# Patient Record
Sex: Female | Born: 1970 | Race: White | Hispanic: No | Marital: Single | Smoking: Never smoker
Health system: Southern US, Community
[De-identification: ages and names within clinical notes are randomized; demographics above are authoritative.]

## PROBLEM LIST (undated history)

## (undated) DIAGNOSIS — T7840XA Allergy, unspecified, initial encounter: Secondary | ICD-10-CM

## (undated) HISTORY — PX: TONSILLECTOMY: SUR1361

## (undated) HISTORY — DX: Allergy, unspecified, initial encounter: T78.40XA

## (undated) HISTORY — PX: DENTAL SURGERY: SHX609

---

## 2012-08-22 ENCOUNTER — Other Ambulatory Visit: Payer: Self-pay | Admitting: Neurosurgery

## 2012-08-22 DIAGNOSIS — M545 Low back pain, unspecified: Secondary | ICD-10-CM

## 2014-10-08 ENCOUNTER — Ambulatory Visit (INDEPENDENT_AMBULATORY_CARE_PROVIDER_SITE_OTHER): Payer: Self-pay | Admitting: Family Medicine

## 2014-10-08 VITALS — BP 100/64 | HR 76 | Ht 65.8 in | Wt 133.0 lb

## 2014-10-08 DIAGNOSIS — Z9289 Personal history of other medical treatment: Secondary | ICD-10-CM

## 2014-10-08 NOTE — Progress Notes (Signed)
[employer] PE 1st class

## 2014-10-12 ENCOUNTER — Encounter: Payer: Self-pay | Admitting: Family Medicine

## 2017-02-22 ENCOUNTER — Other Ambulatory Visit: Payer: Self-pay | Admitting: Obstetrics and Gynecology

## 2017-02-22 DIAGNOSIS — R928 Other abnormal and inconclusive findings on diagnostic imaging of breast: Secondary | ICD-10-CM

## 2017-02-26 ENCOUNTER — Ambulatory Visit
Admission: RE | Admit: 2017-02-26 | Discharge: 2017-02-26 | Disposition: A | Discharge status: Home / Self Care | Payer: BLUE CROSS/BLUE SHIELD | Source: Ambulatory Visit | Attending: Obstetrics and Gynecology | Admitting: Obstetrics and Gynecology

## 2017-02-26 ENCOUNTER — Ambulatory Visit
Admission: RE | Admit: 2017-02-26 | Discharge: 2017-02-26 | Disposition: A | Discharge status: Home / Self Care | Payer: Self-pay | Source: Ambulatory Visit | Attending: Obstetrics and Gynecology | Admitting: Obstetrics and Gynecology

## 2017-02-26 DIAGNOSIS — R928 Other abnormal and inconclusive findings on diagnostic imaging of breast: Secondary | ICD-10-CM

## 2017-02-28 ENCOUNTER — Other Ambulatory Visit: Payer: Self-pay

## 2018-05-07 IMAGING — MG 2D DIGITAL DIAGNOSTIC UNILATERAL RIGHT MAMMOGRAM WITH CAD AND AD
9 series · 9 of 21 positions shown · non-contrast
Comparison: Previous exam(s).

CLINICAL DATA: 46-year-old female recalled from screening mammogram
dated 02/20/2017 for a possible right breast asymmetry.

EXAM:
2D DIGITAL DIAGNOSTIC UNILATERAL RIGHT MAMMOGRAM WITH CAD AND
ADJUNCT TOMO

[R CC synth-2D]
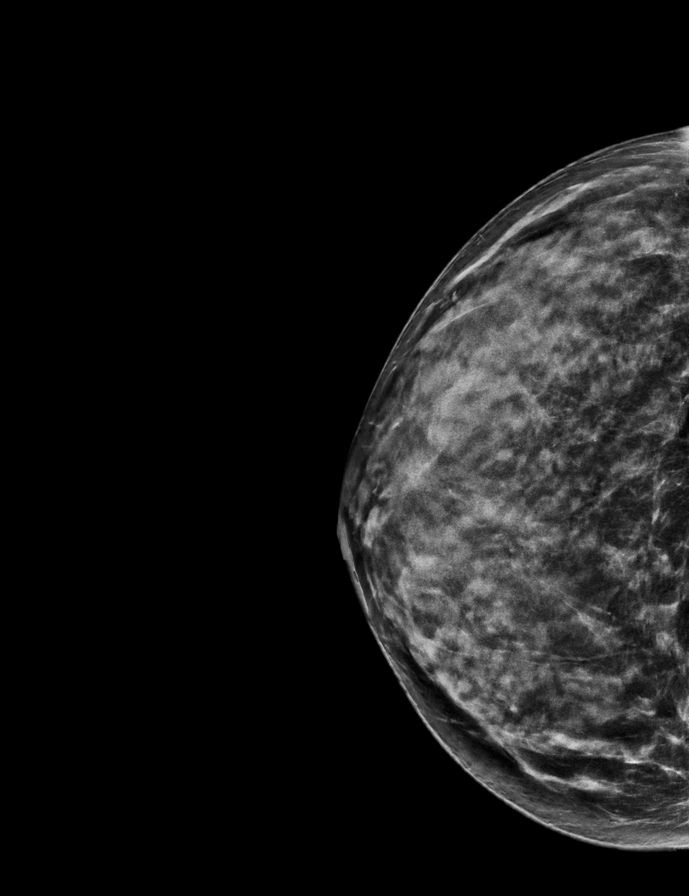

[R MLO synth-2D (1 of 2)]
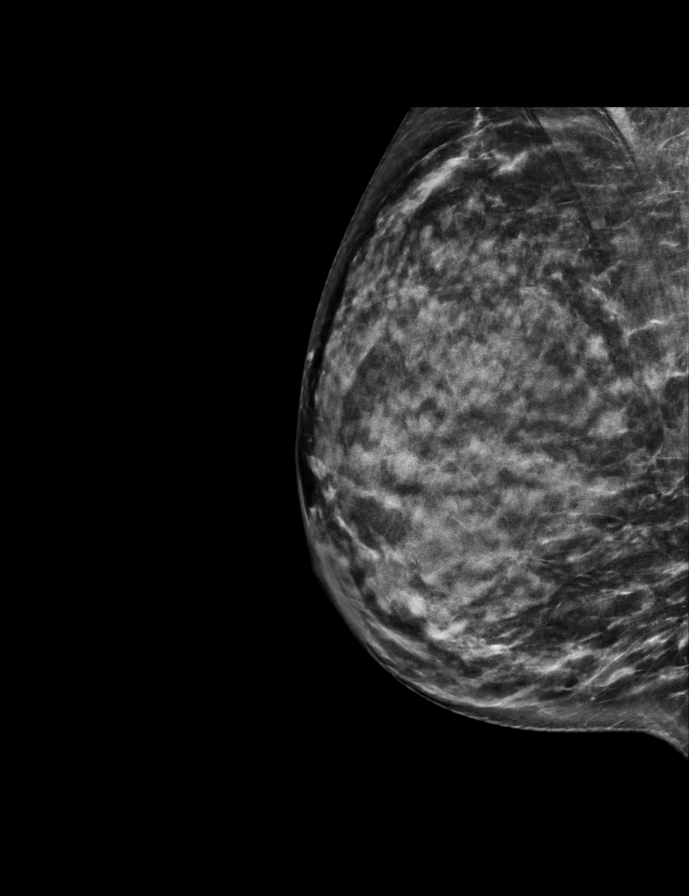

[R CC]
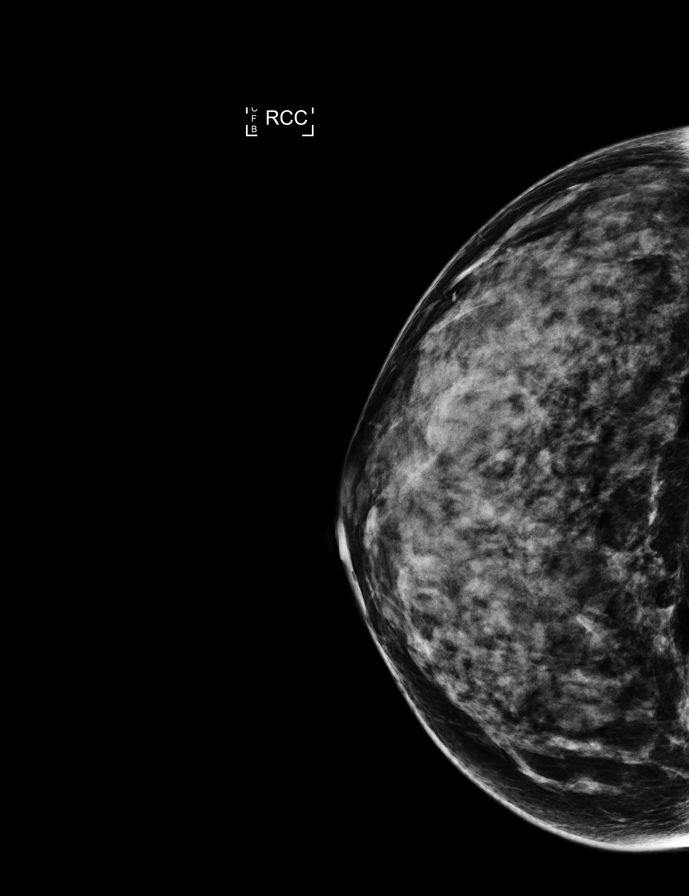

[R MLO synth-2D (2 of 2)]
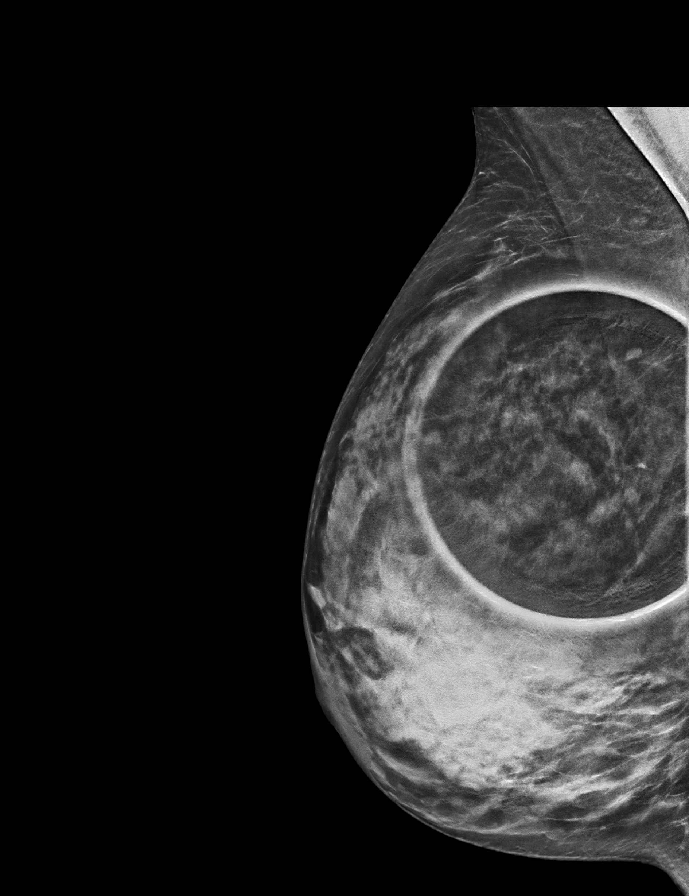

[R MLO (1 of 2)]
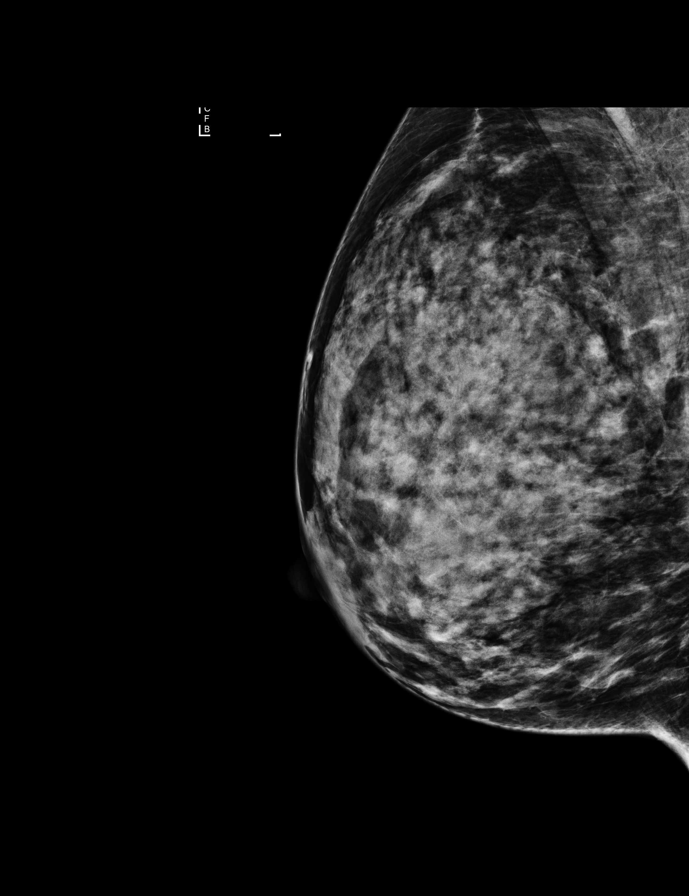

[R MLO (2 of 2)]
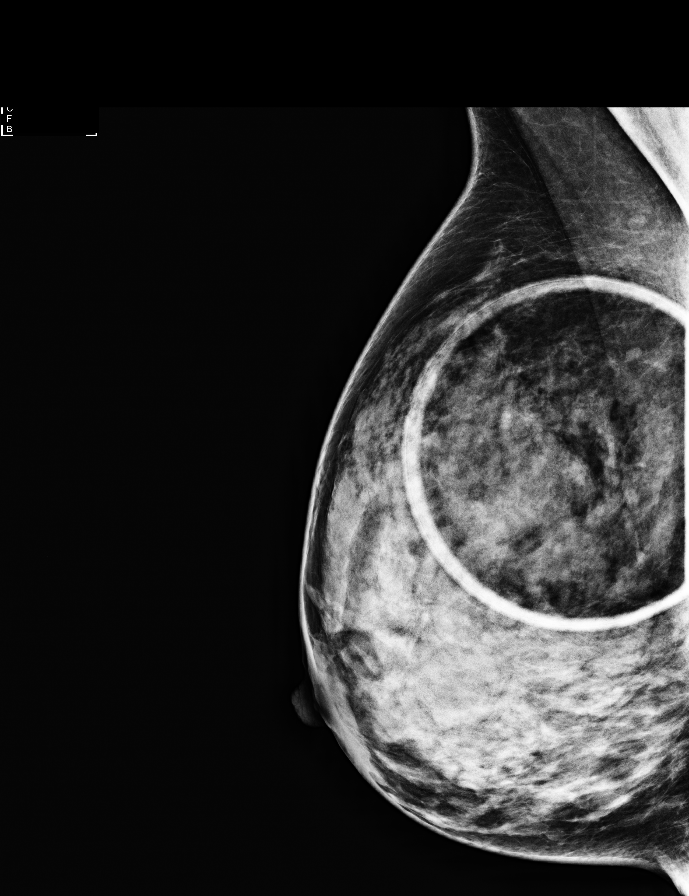

[R MLO tomo (1 of 2) · tomo slice 27/54.0]
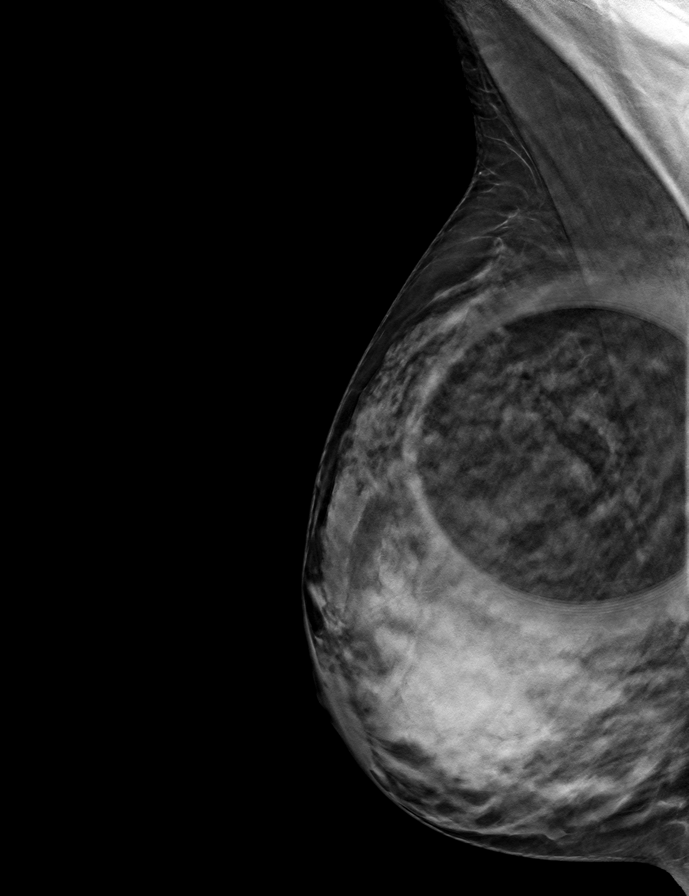

[R CC tomo · tomo slice 29/57.0]
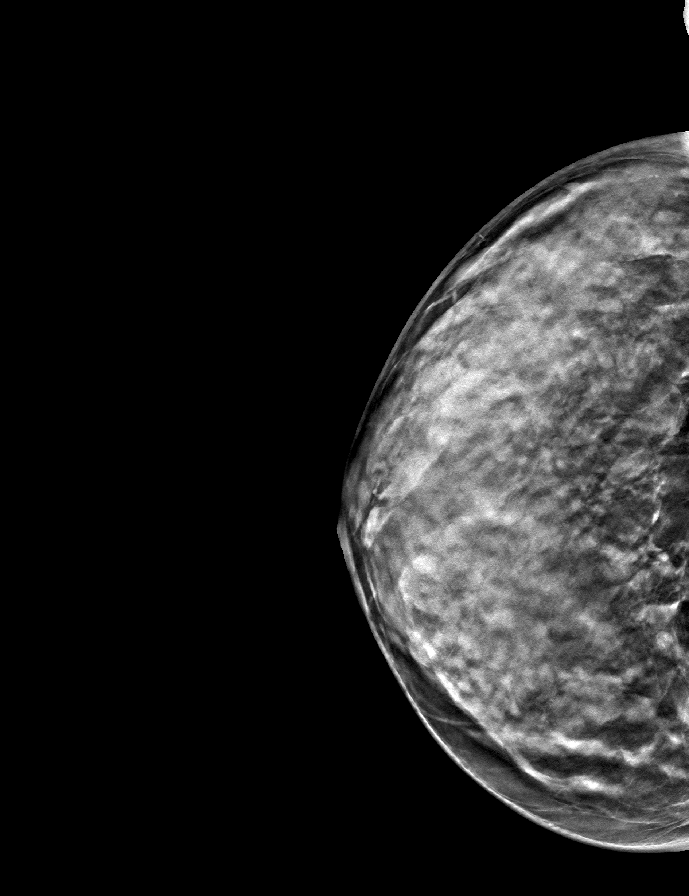

[R MLO tomo (2 of 2) · tomo slice 29/58.0]
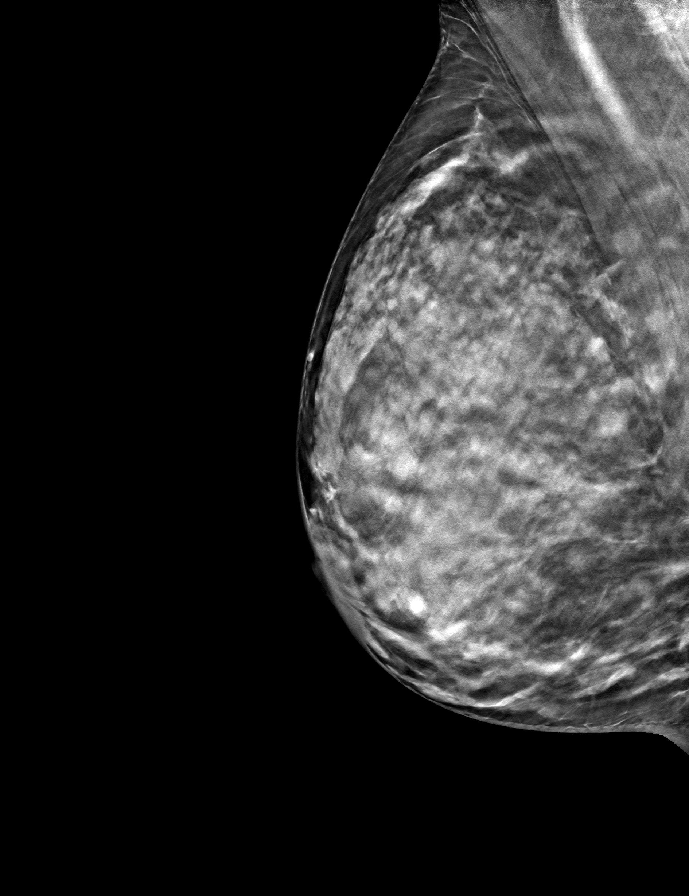

[9 of 21 positions shown; findings below may reference images not displayed]

ACR Breast Density Category d: The breast tissue is extremely dense,
which lowers the sensitivity of mammography.
FINDINGS: The previously described possible asymmetry in the far posterior
central aspect on the CC projection resolves into well dispersed
fibroglandular tissue on today's additional views. No suspicious
mammographic findings are identified.

Mammographic images were processed with CAD.
IMPRESSION: No mammographic evidence of malignancy.

RECOMMENDATION:
Screening mammogram in one year.(Code:[id number])

I have discussed the findings and recommendations with the patient.
Results were also provided in writing at the conclusion of the
visit. If applicable, a reminder letter will be sent to the patient
regarding the next appointment.

BI-RADS CATEGORY  1: Negative.

## 2020-01-23 ENCOUNTER — Encounter: Payer: Self-pay | Admitting: Family Medicine

## 2020-01-23 ENCOUNTER — Other Ambulatory Visit: Payer: Self-pay

## 2020-01-23 ENCOUNTER — Ambulatory Visit (INDEPENDENT_AMBULATORY_CARE_PROVIDER_SITE_OTHER): Payer: Managed Care, Other (non HMO) | Admitting: Family Medicine

## 2020-01-23 VITALS — BP 98/60 | HR 95 | Temp 98.8°F | Ht 65.0 in | Wt 134.4 lb

## 2020-01-23 DIAGNOSIS — Z1159 Encounter for screening for other viral diseases: Secondary | ICD-10-CM

## 2020-01-23 DIAGNOSIS — Z0000 Encounter for general adult medical examination without abnormal findings: Secondary | ICD-10-CM | POA: Diagnosis not present

## 2020-01-23 DIAGNOSIS — L239 Allergic contact dermatitis, unspecified cause: Secondary | ICD-10-CM | POA: Diagnosis not present

## 2020-01-23 NOTE — Progress Notes (Signed)
Subjective  Chief Complaint  Patient presents with  . 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 Annual Exam    rash present on right arm - after Moderna vaccine     HPI: Cynthia Drake is a 49 y.o. female who presents to [facility] today for a Female Wellness Visit.   Wellness Visit: annual visit with health maintenance review and exam without Pap   Health maintenance: 49 year old G1 P0-0-1-0 premenopausal female presents for physical.  Saw her gynecologist in April.  Reports normal mammogram and Pap smear at that time.  Healthy.  Healthy lifestyle.  Surfer, pilot.  Happy.  Has a part-time boyfriend.  No history of major health problems.  He has rash on her right arm.  Started over the last several months.  [facility]  Not spreading.  Itching.  Assessment  1. Annual physical exam   2. Need for hepatitis C screening test   3. Allergic contact dermatitis, unspecified trigger      Plan  Female Wellness Visit:  Age appropriate Health Maintenance and Prevention measures were discussed with patient. Included topics are cancer screening recommendations, ways to keep healthy (see AVS) including dietary and exercise recommendations, regular eye and dental care, use of seat belts, and avoidance of moderate alcohol use and tobacco use.  Screens are up-to-date.  BMI: discussed patient's BMI and encouraged positive lifestyle modifications to help get to or maintain a target BMI.  HM needs and immunizations were addressed and ordered. See below for orders. See HM and immunization section for updates.  Declines flu shot today.  Routine labs and screening tests ordered including cmp, cbc and lipids where appropriate.  Discussed recommendations regarding Vit D and calcium supplementation (see AVS)  Rash most consistent with an irritant or allergic contact  dermatitis.  Localized.  Trial of steroid cream.  She has follow-up appoint with dermatology if needed.  Follow up: Return in about 1 year (around 01/22/2021) for complete physical.   Orders Placed This Encounter  Procedures  . CBC with Differential/Platelet  . COMPLETE METABOLIC PANEL WITH GFR  . Lipid panel  . TSH  . HIV Antibody (routine testing w rflx)  . Hepatitis C antibody   No orders of the defined types were placed in this encounter.    Lifestyle: Body mass index is 22.37 kg/m. Wt Readings from Last 3 Encounters:  01/23/20 134 lb 6.4 oz (61 kg)  10/08/14 133 lb (60.3 kg)     There are no problems to display for this patient.  Health Maintenance  Topic Date Due  . Hepatitis C Screening  Never done  . HIV Screening  Never done  . TETANUS/TDAP  Never done  . INFLUENZA VACCINE  07/22/2020 (Originally 11/23/2019)  . PAP SMEAR-Modifier  07/24/2022  . COVID-19 Vaccine  Completed   Immunization History  Administered Date(s) Administered  . Moderna SARS-COVID-2 Vaccination 11/19/2019, 12/17/2019   We updated and reviewed the patient's past history in detail and it is documented below. Allergies: Patient is allergic to latex. Past Medical History Patient  has a past medical history of Allergy. Past Surgical History Patient  has a past surgical history that includes Tonsillectomy and Dental surgery. Family History: Patient family history is not on file. Social History:  Patient  reports that she has never smoked. She has never used  smokeless tobacco.  Review of Systems: Constitutional: negative for fever or malaise Ophthalmic: negative for photophobia, double vision or loss of vision Cardiovascular: negative for chest pain, dyspnea on exertion, or new LE swelling Respiratory: negative for SOB or persistent cough Gastrointestinal: negative for abdominal pain, change in bowel habits or melena Genitourinary: negative for dysuria or gross hematuria, no abnormal  uterine bleeding or disharge Musculoskeletal: negative for new gait disturbance or muscular weakness Integumentary: negative for new or persistent rashes, no breast lumps Neurological: negative for TIA or stroke symptoms Psychiatric: negative for SI or delusions Allergic/Immunologic: negative for hives  Patient Care Team    Relationship Specialty Notifications Start End  Willow Ora, MD PCP - General Family Medicine  01/23/20   Willow Ora, MD Consulting Physician Family Medicine  01/23/20 01/23/20  Waynard Reeds, MD Consulting Physician Obstetrics and Gynecology  01/23/20   Elmon Else, MD Consulting Physician Dermatology  01/23/20     Objective  Vitals: BP 98/60   Pulse 95   Temp 98.8 F (37.1 C) (Temporal)   Ht 5\' 5"  (1.651 m)   Wt 134 lb 6.4 oz (61 kg)   SpO2 98%   BMI 22.37 kg/m  General:  Well developed, well nourished, no acute distress  Psych:  Alert and orientedx3,normal mood and affect HEENT:  Normocephalic, atraumatic, non-icteric sclera, supple neck without adenopathy, mass or thyromegaly Cardiovascular:  Normal S1, S2, RRR without gallop, rub or murmur Respiratory:  Good breath sounds bilaterally, CTAB with normal respiratory effort Gastrointestinal: normal bowel sounds, soft, non-tender, no noted masses. No HSM MSK: no deformities, contusions. Joints are without erythema or swelling.  Skin:  Warm, right arm with several papules, some with flaking.  Some scabbed.  No other rash throughout body. Neurologic:    Mental status is normal. CN 2-11 are normal. Gross motor and sensory exams are normal. Normal gait. No tremor    Commons side effects, risks, benefits, and alternatives for medications and treatment plan prescribed today were discussed, and the patient expressed understanding of the given instructions. Patient is instructed to call or message via MyChart if he/she has any questions or concerns regarding our treatment plan. No barriers to understanding were  identified. We discussed Red Flag symptoms and signs in detail. Patient expressed understanding regarding what to do in case of urgent or emergency type symptoms.   Medication list was reconciled, printed and provided to the patient in AVS. Patient instructions and summary information was reviewed with the patient as documented in the AVS. This note was prepared with assistance of Dragon voice recognition software. Occasional wrong-word or sound-a-like substitutions may have occurred due to the inherent limitations of voice recognition software  This visit occurred during the SARS-CoV-2 public health emergency.  Safety protocols were in place, including screening questions prior to the visit, additional usage of staff PPE, and extensive cleaning of exam room while observing appropriate contact time as indicated for disinfecting solutions.

## 2020-01-23 NOTE — Patient Instructions (Addendum)
Please return in 12 months for your annual complete physical; please come fasting.  Please sign up for Mychart. I have texted you the link. I will release your lab results to you on your MyChart account with further instructions. Please reply with any questions.   Check to see if you've had your Tdap, Td or tetanus vaccination within the last year and let me know.  It was a pleasure meeting you today! Thank you for choosing [place] to meet your healthcare needs! I truly look forward to working with you. If you have any questions or concerns, please send me a message via Mychart or call the office at [phone].   Preventive Care 47-27 Years Old, Female Preventive care refers to visits with your health care provider and lifestyle choices that can promote health and wellness. This includes:  A yearly physical exam. This may also be called an annual well check.  Regular dental visits and eye exams.  Immunizations.  Screening for certain conditions.  Healthy lifestyle choices, such as eating a healthy diet, getting regular exercise, not using drugs or products that contain nicotine and tobacco, and limiting alcohol use. What can I expect for my preventive care visit? Physical exam Your health care provider will check your:  Height and weight. This may be used to calculate body mass index (BMI), which tells if you are at a healthy weight.  Heart rate and blood pressure.  Skin for abnormal spots. Counseling Your health care provider may ask you questions about your:  Alcohol, tobacco, and drug use.  Emotional well-being.  Home and relationship well-being.  Sexual activity.  Eating habits.  Work and work Statistician.  Method of birth control.  Menstrual cycle.  Pregnancy history. What immunizations do I need?  Influenza (flu) vaccine  This is recommended every year. Tetanus, diphtheria, and pertussis (Tdap) vaccine  You may need a Td booster every 10 years. Varicella  (chickenpox) vaccine  You may need this if you have not been vaccinated. Human papillomavirus (HPV) vaccine  If recommended by your health care provider, you may need three doses over 6 months. Measles, mumps, and rubella (MMR) vaccine  You may need at least one dose of MMR. You may also need a second dose. Meningococcal conjugate (MenACWY) vaccine  One dose is recommended if you are age 59-21 years and a first-year college student living in a residence hall, or if you have one of several medical conditions. You may also need additional booster doses. Pneumococcal conjugate (PCV13) vaccine  You may need this if you have certain conditions and were not previously vaccinated. Pneumococcal polysaccharide (PPSV23) vaccine  You may need one or two doses if you smoke cigarettes or if you have certain conditions. Hepatitis A vaccine  You may need this if you have certain conditions or if you travel or work in places where you may be exposed to hepatitis A. Hepatitis B vaccine  You may need this if you have certain conditions or if you travel or work in places where you may be exposed to hepatitis B. Haemophilus influenzae type b (Hib) vaccine  You may need this if you have certain conditions. You may receive vaccines as individual doses or as more than one vaccine together in one shot (combination vaccines). Talk with your health care provider about the risks and benefits of combination vaccines. What tests do I need?  Blood tests  Lipid and cholesterol levels. These may be checked every 5 years starting at age 24.  Hepatitis  C test.  Hepatitis B test. Screening  Diabetes screening. This is done by checking your blood sugar (glucose) after you have not eaten for a while (fasting).  Sexually transmitted disease (STD) testing.  BRCA-related cancer screening. This may be done if you have a family history of breast, ovarian, tubal, or peritoneal cancers.  Pelvic exam and Pap test.  This may be done every 3 years starting at age 70. Starting at age 29, this may be done every 5 years if you have a Pap test in combination with an HPV test. Talk with your health care provider about your test results, treatment options, and if necessary, the need for more tests. Follow these instructions at home: Eating and drinking   Eat a diet that includes fresh fruits and vegetables, whole grains, lean protein, and low-fat dairy.  Take vitamin and mineral supplements as recommended by your health care provider.  Do not drink alcohol if: ? Your health care provider tells you not to drink. ? You are pregnant, may be pregnant, or are planning to become pregnant.  If you drink alcohol: ? Limit how much you have to 0-1 drink a day. ? Be aware of how much alcohol is in your drink. In the [place] one drink equals one 12 oz bottle of beer (355 mL), one 5 oz glass of wine (148 mL), or one 1 oz glass of hard liquor (44 mL). Lifestyle  Take daily care of your teeth and gums.  Stay active. Exercise for at least 30 minutes on 5 or more days each week.  Do not use any products that contain nicotine or tobacco, such as cigarettes, e-cigarettes, and chewing tobacco. If you need help quitting, ask your health care provider.  If you are sexually active, practice safe sex. Use a condom or other form of birth control (contraception) in order to prevent pregnancy and STIs (sexually transmitted infections). If you plan to become pregnant, see your health care provider for a preconception visit. What's next?  Visit your health care provider once a year for a well check visit.  Ask your health care provider how often you should have your eyes and teeth checked.  Stay up to date on all vaccines. This information is not intended to replace advice given to you by your health care provider. Make sure you discuss any questions you have with your health care provider. Document Revised: 12/20/2017 Document  Reviewed: 12/20/2017 Elsevier Patient Education  2020 [employer].

## 2020-01-25 ENCOUNTER — Encounter: Payer: Self-pay | Admitting: Family Medicine

## 2020-01-26 LAB — CBC WITH DIFFERENTIAL/PLATELET
Absolute Monocytes: 319 cells/uL (ref 200–950)
Basophils Absolute: 39 cells/uL (ref 0–200)
Basophils Relative: 0.7 %
Eosinophils Absolute: 62 cells/uL (ref 15–500)
Eosinophils Relative: 1.1 %
HCT: 40.6 % (ref 35.0–45.0)
Hemoglobin: 14 g/dL (ref 11.7–15.5)
Lymphs Abs: 1971 cells/uL (ref 850–3900)
MCH: 31.3 pg (ref 27.0–33.0)
MCHC: 34.5 g/dL (ref 32.0–36.0)
MCV: 90.6 fL (ref 80.0–100.0)
MPV: 10.7 fL (ref 7.5–12.5)
Monocytes Relative: 5.7 %
Neutro Abs: 3209 cells/uL (ref 1500–7800)
Neutrophils Relative %: 57.3 %
Platelets: 185 10*3/uL (ref 140–400)
RBC: 4.48 10*6/uL (ref 3.80–5.10)
RDW: 12.2 % (ref 11.0–15.0)
Total Lymphocyte: 35.2 %
WBC: 5.6 10*3/uL (ref 3.8–10.8)

## 2020-01-26 LAB — COMPLETE METABOLIC PANEL WITH GFR
AG Ratio: 2.5 (calc) (ref 1.0–2.5)
ALT: 10 U/L (ref 6–29)
AST: 14 U/L (ref 10–35)
Albumin: 4.7 g/dL (ref 3.6–5.1)
Alkaline phosphatase (APISO): 55 U/L (ref 31–125)
BUN: 9 mg/dL (ref 7–25)
CO2: 29 mmol/L (ref 20–32)
Calcium: 9.2 mg/dL (ref 8.6–10.2)
Chloride: 103 mmol/L (ref 98–110)
Creat: 0.77 mg/dL (ref 0.50–1.10)
GFR, Est African American: 107 mL/min/{1.73_m2} (ref >=60)
GFR, Est Non African American: 92 mL/min/{1.73_m2} (ref >=60)
Globulin: 1.9 g/dL (calc) (ref 1.9–3.7)
Glucose, Bld: 90 mg/dL (ref 65–99)
Potassium: 3.7 mmol/L (ref 3.5–5.3)
Sodium: 139 mmol/L (ref 135–146)
Total Bilirubin: 0.6 mg/dL (ref 0.2–1.2)
Total Protein: 6.6 g/dL (ref 6.1–8.1)

## 2020-01-26 LAB — LIPID PANEL
Cholesterol: 206 mg/dL — ABNORMAL HIGH (ref <200)
HDL: 61 mg/dL (ref >=50)
LDL Cholesterol (Calc): 130 mg/dL (calc) — ABNORMAL HIGH
Non-HDL Cholesterol (Calc): 145 mg/dL (calc) — ABNORMAL HIGH (ref <130)
Total CHOL/HDL Ratio: 3.4 (calc) (ref <5.0)
Triglycerides: 56 mg/dL (ref <150)

## 2020-01-26 LAB — TSH: TSH: 2.1 mIU/L

## 2020-01-26 LAB — HEPATITIS C ANTIBODY
Hepatitis C Ab: NONREACTIVE
SIGNAL TO CUT-OFF: 0.01 (ref <1.00)

## 2020-01-26 LAB — HIV ANTIBODY (ROUTINE TESTING W REFLEX): HIV 1&2 Ab, 4th Generation: NONREACTIVE

## 2020-04-28 ENCOUNTER — Ambulatory Visit: Payer: BLUE CROSS/BLUE SHIELD | Admitting: Family Medicine

## 2021-01-27 ENCOUNTER — Encounter: Payer: Managed Care, Other (non HMO) | Admitting: Family Medicine

## 2021-02-09 ENCOUNTER — Other Ambulatory Visit: Payer: Self-pay

## 2021-02-09 ENCOUNTER — Encounter: Payer: Self-pay | Admitting: Family Medicine

## 2021-02-09 ENCOUNTER — Ambulatory Visit (INDEPENDENT_AMBULATORY_CARE_PROVIDER_SITE_OTHER): Payer: BC Managed Care – PPO | Admitting: Family Medicine

## 2021-02-09 VITALS — BP 106/70 | HR 72 | Temp 97.9°F | Ht 65.0 in | Wt 142.4 lb

## 2021-02-09 DIAGNOSIS — S39012A Strain of muscle, fascia and tendon of lower back, initial encounter: Secondary | ICD-10-CM

## 2021-02-09 MED ORDER — DICLOFENAC SODIUM 75 MG PO TBEC: 75.0000 mg | DELAYED_RELEASE_TABLET | Freq: Two times a day (BID) | ORAL | 0 refills | Status: AC

## 2021-02-09 MED ORDER — CYCLOBENZAPRINE HCL 10 MG PO TABS: 10.0000 mg | ORAL_TABLET | Freq: Three times a day (TID) | ORAL | 0 refills | Status: AC | PRN

## 2021-02-09 MED ORDER — TRAMADOL HCL 50 MG PO TABS: 50.0000 mg | ORAL_TABLET | Freq: Three times a day (TID) | ORAL | 0 refills | Status: AC | PRN

## 2021-02-09 NOTE — Patient Instructions (Signed)
Please return in 1-2 weeks to recheck if not improving. Can schedule a complete physical as well. Last was oct 2021.  If you have any questions or concerns, please don't hesitate to send me a message via MyChart or call the office at [phone]. Thank you for visiting with [place] today! It's our pleasure caring for you.   Lumbar Strain A lumbar strain, which is sometimes called a low-back strain, is a stretch or tear in a muscle or the strong cords of tissue that attach muscle to bone (tendons) in the lower back (lumbar spine). This type of injury occurs when muscles or tendons are torn or are stretched beyond their limits. Lumbar strains can range from mild to severe. Mild strains may involve stretching a muscle or tendon without tearing it. These may heal in 1-2 weeks. More severe strains involve tearing of muscle fibers or tendons. These will cause more pain and may take 6-8 weeks to heal. What are the causes? This condition may be caused by: Trauma, such as a fall or a hit to the body. Twisting or overstretching the back. This may result from doing activities that need a lot of energy, such as lifting heavy objects. What increases the risk? This injury is more common in: Athletes. People with obesity. People who do repeated lifting, bending, or other movements that involve their back. What are the signs or symptoms? Symptoms of this condition may include: Sharp or dull pain in the lower back that does not go away. The pain may extend to the buttocks. Stiffness or limited range of motion. Sudden muscle tightening (spasms). How is this diagnosed? This condition may be diagnosed based on: Your symptoms. Your medical history. A physical exam. Imaging tests, such as: X-rays. MRI. How is this treated? Treatment for this condition may include: Rest. Applying heat and cold to the affected area. Over-the-counter medicines to help relieve pain and inflammation, such as NSAIDs. Prescription  pain medicine and muscle relaxants may be needed for a short time. Physical therapy. Follow these instructions at home: Managing pain, stiffness, and swelling   If directed, put ice on the injured area during the first 24 hours after your injury. Put ice in a plastic bag. Place a towel between your skin and the bag. Leave the ice on for 20 minutes, 2-3 times a day. If directed, apply heat to the affected area as often as told by your health care provider. Use the heat source that your health care provider recommends, such as a moist heat pack or a heating pad. Place a towel between your skin and the heat source. Leave the heat on for 20-30 minutes. Remove the heat if your skin turns bright red. This is especially important if you are unable to feel pain, heat, or cold. You may have a greater risk of getting burned. Activity Rest and return to your normal activities as told by your health care provider. Ask your health care provider what activities are safe for you. Do exercises as told by your health care provider. Medicines Take over-the-counter and prescription medicines only as told by your health care provider. Ask your health care provider if the medicine prescribed to you: Requires you to avoid driving or using heavy machinery. Can cause constipation. You may need to take these actions to prevent or treat constipation: Drink enough fluid to keep your urine pale yellow. Take over-the-counter or prescription medicines. Eat foods that are high in fiber, such as beans, whole grains, and fresh fruits and  vegetables. Limit foods that are high in fat and processed sugars, such as fried or sweet foods. Injury prevention To prevent a future low-back injury: Always warm up properly before physical activity or sports. Cool down and stretch after being active. Use correct form when playing sports and lifting heavy objects. Bend your knees before you lift heavy objects. Use good posture when  sitting and standing. Stay physically fit and keep a healthy weight. Do at least 150 minutes of moderate-intensity exercise each week, such as brisk walking or water aerobics. Do strength exercises at least 2 times each week.  General instructions Do not use any products that contain nicotine or tobacco, such as cigarettes, e-cigarettes, and chewing tobacco. If you need help quitting, ask your health care provider. Keep all follow-up visits as told by your health care provider. This is important. Contact a health care provider if: Your back pain does not improve after 6 weeks of treatment. Your symptoms get worse. Get help right away if: Your back pain is severe. You are unable to stand or walk. You develop pain in your legs. You develop weakness in your buttocks or legs. You have difficulty controlling when you urinate or when you have a bowel movement. You have frequent, painful, or bloody urination. You have a temperature over 101.87F (38.3C) Summary A lumbar strain, which is sometimes called a low-back strain, is a stretch or tear in a muscle or the strong cords of tissue that attach muscle to bone (tendons) in the lower back (lumbar spine). This type of injury occurs when muscles or tendons are torn or are stretched beyond their limits. Rest and return to your normal activities as told by your health care provider. If directed, apply heat and ice to the affected area as often as told by your health care provider. Take over-the-counter and prescription medicines only as told by your health care provider. Contact a health care provider if you have new or worsening symptoms. This information is not intended to replace advice given to you by your health care provider. Make sure you discuss any questions you have with your health care provider. Document Revised: 02/07/2018 Document Reviewed: 02/07/2018 Elsevier Patient Education  2022 [employer].

## 2021-02-10 NOTE — Progress Notes (Signed)
Subjective  CC:  Chief Complaint  Patient presents with   Back Pain    Lower, started again yesterday. Low back has been tender for months    HPI: Cynthia Drake is a 50 y.o. female who presents to the office today to address the problems listed above in the chief complaint. 50 year old healthy female complains of significant low back pain limiting activities.  She reports that approximately 2 weeks ago she felt a pull in her low back where she was pulling up her suitcase onto a bus.  Symptoms were minimal.  However yesterday, she went to stand up and felt a significant catch in her back.  Since she has had significant stiffness, pain with movement, without radicular symptoms.  She has no known chronic back problems.  She does report that intermittently her back will "go out".  She is taken some over-the-counter anti-inflammatories which have been helpful.  Because the pain is so significant she is using a walker.  She denies lower extremity weakness, bowel or bladder dysfunction.  No fevers or chills.  Assessment  1. Strain of lumbar region, initial encounter      Plan  Lumbar strain: Education given.  No radicular symptoms at this time.  Will treat with anti-inflammatories, muscle relaxant, heat and tramadol if needed for moderate pain.  Recommend stretching exercises.  Follow-up in 2 weeks if not improving.  Discussed red flag symptoms.  Follow up: 2 weeks if needed. Visit date not found  No orders of the defined types were placed in this encounter.  Meds ordered this encounter  Medications   cyclobenzaprine (FLEXERIL) 10 MG tablet    Sig: Take 1 tablet (10 mg total) by mouth 3 (three) times daily as needed for muscle spasms.    Dispense:  30 tablet    Refill:  0   diclofenac (VOLTAREN) 75 MG EC tablet    Sig: Take 1 tablet (75 mg total) by mouth 2 (two) times daily.    Dispense:  30 tablet    Refill:  0   traMADol (ULTRAM) 50 MG tablet    Sig: Take 1 tablet (50 mg total) by  mouth every 8 (eight) hours as needed for up to 5 days.    Dispense:  15 tablet    Refill:  0      I reviewed the patients updated PMH, FH, and SocHx.    There are no problems to display for this patient.  Current Meds  Medication Sig   cyclobenzaprine (FLEXERIL) 10 MG tablet Take 1 tablet (10 mg total) by mouth 3 (three) times daily as needed for muscle spasms.   diclofenac (VOLTAREN) 75 MG EC tablet Take 1 tablet (75 mg total) by mouth 2 (two) times daily.   traMADol (ULTRAM) 50 MG tablet Take 1 tablet (50 mg total) by mouth every 8 (eight) hours as needed for up to 5 days.    Allergies: Patient is allergic to latex. Family History: Patient family history is not on file. Social History:  Patient  reports that she has never smoked. She has never used smokeless tobacco.  Review of Systems: Constitutional: Negative for fever malaise or anorexia Cardiovascular: negative for chest pain Respiratory: negative for SOB or persistent cough Gastrointestinal: negative for abdominal pain  Objective  Vitals: BP 106/70   Pulse 72   Temp 97.9 F (36.6 C) (Temporal)   Ht 5\' 5"  (1.651 m)   Wt 142 lb 6.4 oz (64.6 kg)   SpO2 97%  BMI 23.70 kg/m  General: no acute distress , A&Ox3 Antalgic gait, significantly decreased forward flexion due to pain.  No tenderness of the low back.  Negative DTRs seated bilaterally, +2 lower extremity reflexes that are symmetric.  Normal lower extremity strength.    Commons side effects, risks, benefits, and alternatives for medications and treatment plan prescribed today were discussed, and the patient expressed understanding of the given instructions. Patient is instructed to call or message via MyChart if he/she has any questions or concerns regarding our treatment plan. No barriers to understanding were identified. We discussed Red Flag symptoms and signs in detail. Patient expressed understanding regarding what to do in case of urgent or emergency type  symptoms.  Medication list was reconciled, printed and provided to the patient in AVS. Patient instructions and summary information was reviewed with the patient as documented in the AVS. This note was prepared with assistance of Dragon voice recognition software. Occasional wrong-word or sound-a-like substitutions may have occurred due to the inherent limitations of voice recognition software  This visit occurred during the SARS-CoV-2 public health emergency.  Safety protocols were in place, including screening questions prior to the visit, additional usage of staff PPE, and extensive cleaning of exam room while observing appropriate contact time as indicated for disinfecting solutions.

## 2022-01-16 ENCOUNTER — Encounter: Payer: Self-pay | Admitting: *Deleted

## 2022-04-06 ENCOUNTER — Encounter: Payer: Self-pay | Admitting: *Deleted

## 2023-02-21 ENCOUNTER — Encounter: Payer: BC Managed Care – PPO | Admitting: Family Medicine
